# Patient Record
Sex: Male | Born: 2000 | Race: White | Hispanic: No | Marital: Single
Health system: Southern US, Community
[De-identification: ages and names within clinical notes are randomized; demographics above are authoritative.]

---
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
## 2022-01-10 ENCOUNTER — Ambulatory Visit: Payer: Self-pay | Admitting: *Deleted

## 2022-01-10 NOTE — Telephone Encounter (Signed)
   Chief Complaint: burning with urination, discharge Symptoms: burning, discharge from penis Frequency: 1-2 weeks Pertinent Negatives: Patient denies fever Disposition: [] ED /[x] Urgent Care (no appt availability in office) / [] Appointment(In office/virtual)/ []  [facility]/ [] Home Care/ [] Refused Recommended Disposition /[] [facility]/ []  Follow-up with PCP Additional Notes: No PCP- advised UC or health dept for evaluation    Reason for Disposition  Pus (white, yellow) or bloody discharge from end of penis  Answer Assessment - Initial Assessment Questions 1. SEVERITY: "How bad is the pain?"  (e.g., Scale 1-10; mild, moderate, or severe)   - MILD (1-3): Complains slightly about urination hurting.   - MODERATE (4-7): Interferes with normal activities.     - SEVERE (8-10): Excruciating, unwilling or unable to urinate because of the pain.      moderate 2. FREQUENCY: "How many times have you had painful urination today?"      Every time 3. PATTERN: "Is pain present every time you urinate or just sometimes?"      yes 4. ONSET: "When did the painful urination start?"      1 -2 weeks 5. FEVER: "Do you have a fever?" If Yes, ask: "What is your temperature, how was it measured, and when did it start?"     no 6. PAST UTI: "Have you had a urine infection before?" If Yes, ask: "When was the last time?" and "What happened that time?"      No 7. CAUSE: "What do you think is causing the painful urination?"      Not sure 8. OTHER SYMPTOMS: "Do you have any other symptoms?" (e.g., flank pain, penis discharge, scrotal pain, blood in urine)     Penis discharge  Protocols used: Urination Pain - Male-A-AH

## 2022-01-10 NOTE — Telephone Encounter (Signed)
Summary: Urinary burning/Discharge   The patient called in stating he is having burning really bad when he urinates. He also is experiencing some discharge when he goes. He was wanting to make an appt to get checked for an STD but he is not established yet. Please assist patient further.         Called patient to review sx. No answer, [facility]MT[facility] #[phone].

## 2022-03-06 ENCOUNTER — Ambulatory Visit: Payer: Medicaid Other

## 2022-05-22 ENCOUNTER — Ambulatory Visit: Payer: Medicaid Other | Admitting: Nurse Practitioner

## 2022-05-22 NOTE — Progress Notes (Deleted)
   There were no vitals taken for this visit.   Subjective:    Patient ID: Gwinda Maine, male    DOB: Jul 04, 2000, 22 y.o.   MRN: [id number]  HPI: DIMITRY MEISE is a 22 y.o. male  No chief complaint on file.  Patient presents to clinic to establish care with new PCP.  Introduced to Publishing rights manager role and practice setting.  All questions answered.  Discussed provider/patient relationship and expectations.  Patient reports a history of ***. Patient denies a history of: Hypertension, Elevated Cholesterol, Diabetes, Thyroid problems, Depression, Anxiety, Neurological problems, and Abdominal problems.   Active Ambulatory Problems    Diagnosis Date Noted   No Active Ambulatory Problems   Resolved Ambulatory Problems    Diagnosis Date Noted   No Resolved Ambulatory Problems   No Additional Past Medical History   *** The histories are not reviewed yet. Please review them in the "History" navigator section and refresh this SmartLink. No family history on file.   Review of Systems  Per HPI unless specifically indicated above     Objective:    There were no vitals taken for this visit.  Wt Readings from Last 3 Encounters:  No data found for Wt    Physical Exam  No results found for this or any previous visit.    Assessment & Plan:   Problem List Items Addressed This Visit   None    Follow up plan: No follow-ups on file.
